# Patient Record
Sex: Female | Born: 1965 | Hispanic: Yes | Marital: Married | Smoking: Never smoker
Health system: Southern US, Community
[De-identification: ages and names within clinical notes are randomized; demographics above are authoritative.]

## PROBLEM LIST (undated history)

## (undated) DIAGNOSIS — I10 Essential (primary) hypertension: Secondary | ICD-10-CM

---

## 2007-02-03 ENCOUNTER — Ambulatory Visit: Payer: Self-pay

## 2011-10-07 ENCOUNTER — Ambulatory Visit: Payer: Self-pay | Admitting: Internal Medicine

## 2014-08-10 ENCOUNTER — Emergency Department
Admit: 2014-08-10 | Disposition: A | Discharge status: Home / Self Care | Payer: Self-pay | Admitting: Emergency Medicine

## 2014-09-07 ENCOUNTER — Encounter: Payer: Self-pay | Admitting: Emergency Medicine

## 2014-09-07 ENCOUNTER — Emergency Department
Admission: EM | Admit: 2014-09-07 | Discharge: 2014-09-07 | Disposition: A | Discharge status: Home / Self Care | Payer: Self-pay | Attending: Internal Medicine | Admitting: Internal Medicine

## 2014-09-07 ENCOUNTER — Emergency Department: Payer: Self-pay

## 2014-09-07 ENCOUNTER — Other Ambulatory Visit: Payer: Self-pay

## 2014-09-07 DIAGNOSIS — R197 Diarrhea, unspecified: Secondary | ICD-10-CM | POA: Insufficient documentation

## 2014-09-07 DIAGNOSIS — I10 Essential (primary) hypertension: Secondary | ICD-10-CM | POA: Insufficient documentation

## 2014-09-07 DIAGNOSIS — N390 Urinary tract infection, site not specified: Secondary | ICD-10-CM | POA: Insufficient documentation

## 2014-09-07 DIAGNOSIS — Z3202 Encounter for pregnancy test, result negative: Secondary | ICD-10-CM | POA: Insufficient documentation

## 2014-09-07 DIAGNOSIS — Z79899 Other long term (current) drug therapy: Secondary | ICD-10-CM | POA: Insufficient documentation

## 2014-09-07 DIAGNOSIS — R109 Unspecified abdominal pain: Secondary | ICD-10-CM

## 2014-09-07 HISTORY — DX: Essential (primary) hypertension: I10

## 2014-09-07 LAB — CBC WITH DIFFERENTIAL/PLATELET
BASOS ABS: 0 10*3/uL (ref 0–0.1)
BASOS PCT: 0 %
EOS ABS: 0.3 10*3/uL (ref 0–0.7)
Eosinophils Relative: 4 %
HCT: 37.8 % (ref 35.0–47.0)
Hemoglobin: 12.1 g/dL (ref 12.0–16.0)
LYMPHS ABS: 1.3 10*3/uL (ref 1.0–3.6)
Lymphocytes Relative: 15 %
MCH: 25.8 pg — AB (ref 26.0–34.0)
MCHC: 32 g/dL (ref 32.0–36.0)
MCV: 80.7 fL (ref 80.0–100.0)
Monocytes Absolute: 0.5 10*3/uL (ref 0.2–0.9)
Monocytes Relative: 6 %
NEUTROS PCT: 75 %
Neutro Abs: 6.5 10*3/uL (ref 1.4–6.5)
PLATELETS: 348 10*3/uL (ref 150–440)
RBC: 4.69 MIL/uL (ref 3.80–5.20)
RDW: 15.3 % — ABNORMAL HIGH (ref 11.5–14.5)
WBC: 8.6 10*3/uL (ref 3.6–11.0)

## 2014-09-07 LAB — COMPREHENSIVE METABOLIC PANEL
ALK PHOS: 89 U/L (ref 38–126)
ALT: 35 U/L (ref 14–54)
ANION GAP: 8 (ref 5–15)
AST: 27 U/L (ref 15–41)
Albumin: 4.4 g/dL (ref 3.5–5.0)
BILIRUBIN TOTAL: 0.3 mg/dL (ref 0.3–1.2)
BUN: 14 mg/dL (ref 6–20)
CO2: 29 mmol/L (ref 22–32)
Calcium: 8.3 mg/dL — ABNORMAL LOW (ref 8.9–10.3)
Chloride: 102 mmol/L (ref 101–111)
Creatinine, Ser: 0.6 mg/dL (ref 0.44–1.00)
GLUCOSE: 103 mg/dL — AB (ref 65–99)
POTASSIUM: 3.7 mmol/L (ref 3.5–5.1)
Sodium: 139 mmol/L (ref 135–145)
Total Protein: 8.1 g/dL (ref 6.5–8.1)

## 2014-09-07 LAB — URINALYSIS COMPLETE WITH MICROSCOPIC (ARMC ONLY)
BILIRUBIN URINE: NEGATIVE
GLUCOSE, UA: NEGATIVE mg/dL
Ketones, ur: NEGATIVE mg/dL
Nitrite: NEGATIVE
PROTEIN: NEGATIVE mg/dL
SPECIFIC GRAVITY, URINE: 1.015 (ref 1.005–1.030)
pH: 5 (ref 5.0–8.0)

## 2014-09-07 LAB — POCT PREGNANCY, URINE: PREG TEST UR: NEGATIVE

## 2014-09-07 LAB — LIPASE, BLOOD: Lipase: 36 U/L (ref 22–51)

## 2014-09-07 MED ORDER — DICYCLOMINE HCL 10 MG PO CAPS
10.0000 mg | ORAL_CAPSULE | Freq: Once | ORAL | Status: AC
  Administered 2014-09-07: 10 mg via ORAL

## 2014-09-07 MED ORDER — LOPERAMIDE HCL 2 MG PO TABS: 2.0000 mg | ORAL_TABLET | Freq: Four times a day (QID) | ORAL | Status: AC | PRN

## 2014-09-07 MED ORDER — CIPROFLOXACIN HCL 500 MG PO TABS: 500.0000 mg | ORAL_TABLET | Freq: Two times a day (BID) | ORAL | Status: AC

## 2014-09-07 MED ORDER — DICYCLOMINE HCL 20 MG PO TABS: 20.0000 mg | ORAL_TABLET | Freq: Three times a day (TID) | ORAL | Status: AC | PRN

## 2014-09-07 MED ORDER — DICYCLOMINE HCL 10 MG PO CAPS
ORAL_CAPSULE | ORAL | Status: AC
  Filled 2014-09-07: qty 1

## 2014-09-07 NOTE — Discharge Instructions (Signed)
See your doctor in follow-up as instructed. If your symptoms do not improve or you develop any new or worsening symptoms return to the emergency department. Take the medication as prescribed.

## 2014-09-07 NOTE — ED Provider Notes (Signed)
[facility] Emergency Department Provider Note  ____________________________________________  Time seen: Approximately 9:03 PM  I have reviewed the triage vital signs and the nursing notes.   HISTORY  Chief Complaint Abdominal Pain    HPI Angela Hendricks is a 49 y.o. female with a chief complaint of abdominal pain 5 days . The pain started in the epigastric area 5 days ago and has continued to be in that area it is been for the past 5 days it is worse after she tries to eat, it is moderate 5/10 in severity. She complains of cramping in the abdomen associated with daily diarrhea for the past 5 days.   Today she had 5 bowel movements. The pain in the abdomen comes in waves. She has had 2 episodes of vomiting in the past 5 days the last episode was last evening. She has had decreased by mouth intake for the past several days because of the abdominal pain and diarrhea.   She has had chills but is unaware of having any fever. She's had no travel in the past 6 months.     Past Medical History  Diagnosis Date  . Hypertension     There are no active problems to display for this patient.   History reviewed. No pertinent past surgical history.  Current Outpatient Rx  Name  Route  Sig  Dispense  Refill  . hydrochlorothiazide (HYDRODIURIL) 25 MG tablet   Oral   Take 25 mg by mouth daily.         . traMADol (ULTRAM) 50 MG tablet   Oral   Take 50 mg by mouth every 6 (six) hours as needed.           Allergies Review of patient's allergies indicates no known allergies.  History reviewed. No pertinent family history.  Social History History  Substance Use Topics  . Smoking status: Never Smoker   . Smokeless tobacco: Not on file  . Alcohol Use: Yes    Review of Systems Constitutional: No fever/chills Eyes: No visual changes. ENT: No sore throat. Cardiovascular: Denies chest pain. Respiratory: Denies shortness of  breath. Gastrointestinal: No abdominal pain.  No nausea, no vomiting.  No diarrhea.  No constipation. Genitourinary: Negative for dysuria. Musculoskeletal: Negative for back pain. Skin: Negative for rash. Neurological: Negative for headaches, focal weakness or numbness.  10-point ROS otherwise negative.  ____________________________________________   PHYSICAL EXAM:  VITAL SIGNS: ED Triage Vitals  Enc Vitals Group     BP 09/07/14 1716 128/74 mmHg     Pulse Rate 09/07/14 1716 73     Resp --      Temp 09/07/14 1716 98.3 F (36.8 C)     Temp Source 09/07/14 1716 Oral     SpO2 09/07/14 1716 100 %     Weight 09/07/14 1716 180 lb (81.647 kg)     Height 09/07/14 1716 5\' 3"  (1.6 m)     Head Cir --      Peak Flow --      Pain Score --      Pain Loc --      Pain Edu? --      Excl. in GC? --     Constitutional: Alert and oriented. Well appearing and in no acute distress. Eyes: Conjunctivae are normal. PERRL. EOMI. Head: Atraumatic. Nose: No congestion/rhinnorhea. Mouth/Throat: Mucous membranes are moist.  Oropharynx non-erythematous. Neck: No stridor.   Cardiovascular: Normal rate, regular rhythm. Grossly normal heart sounds.  Good peripheral  circulation. Respiratory: Normal respiratory effort.  No retractions. Lungs CTAB. Gastrointestinal: Soft and slightly tender in the epigastric area or guarding no rebound.. No distention. No abdominal bruits. No CVA tenderness. Musculoskeletal: No lower extremity tenderness nor edema.  No joint effusions. Neurologic:  Normal speech and language. No gross focal neurologic deficits are appreciated. Speech is normal. No gait instability. Skin:  Skin is warm, dry and intact. No rash noted. Psychiatric: Mood and affect are normal. Speech and behavior are normal.  ____________________________________________   LABS (all labs ordered are listed, but only abnormal results are displayed)  Labs Reviewed  CBC WITH DIFFERENTIAL/PLATELET - Abnormal;  Notable for the following:    MCH 25.8 (*)    RDW 15.3 (*)    All other components within normal limits  COMPREHENSIVE METABOLIC PANEL - Abnormal; Notable for the following:    Glucose, Bld 103 (*)    Calcium 8.3 (*)    All other components within normal limits  URINALYSIS COMPLETEWITH MICROSCOPIC ([facility])  - Abnormal; Notable for the following:    Color, Urine YELLOW (*)    APPearance HAZY (*)    Hgb urine dipstick 2+ (*)    Leukocytes, UA 1+ (*)    Bacteria, UA RARE (*)    Squamous Epithelial / LPF 6-30 (*)    All other components within normal limits  LIPASE, BLOOD  POCT PREGNANCY, URINE   __Urine is significant for blood 2+1+ leukocytes and rare bacteria Her WBC is normal at 8.6.  Her rest of her comprehensive medical panel is normal her LFTs are normal. Her glucose is slightly elevated at 103 which is not significant __________________________________________  EKG  ED ECG REPORT   Date: 09/07/2014  EKG Time: 17:24  Rate: 73  Rhythm: normal EKG, normal sinus rhythm, unchanged from previous tracings, normal sinus rhythm  Axis: Normal  Intervals:none  ST&T Change: Nonspecific ST-T wave changes  ____________________________________________  RADIOLOGY  IMPRESSION:abd 3 way x ray No evidence of active pulmonary disease. Normal nonobstructive bowel gas pattern.   Electronically Signed By: Burman NievesWilliam Stevens M.D. On: 09/07/2014 22:07       ____________________________________________   PROCEDURES  Procedure(s) performed: None  Critical Care performed: No  ____________________________________________   INITIAL IMPRESSION / ASSESSMENT AND PLAN / ED COURSE  Pertinent labs & imaging results that were available during my care of the patient were reviewed by me and considered in my medical decision making (see chart for details).  This 49 year old Hispanic female presents with a chief complaint a 5 day history of colicky crampy abdominal pain associated  with predominantly diarrhea and minimal amount of vomiting.  Her abdominal exam is benign. Her white count is not elevated. Her electrolytes are normal. Plain films in the emergency department showed no significant etiology for her pain. Her urine is consistent with a mild UTI  In the emergency department she received Bentyl by mouth. She will be treated with antidiarrhea medication and ciprofloxacin for her urinary tract infection and Bentyl for crampy abdominal pain and discharged. ____________________________________________   FINAL CLINICAL IMPRESSION(S) / ED DIAGNOSES  Final diagnoses:  None   1. Diarrhea 2. UTI 3. Abdominal cramps    Sherlyn HaySheryl L Chloe Miyoshi, DO 09/07/14 2245

## 2014-09-07 NOTE — ED Notes (Signed)
Pt reports upper abd pain since Saturday.  Vomited x 1 last pm

## 2014-09-07 NOTE — ED Notes (Signed)
Pt verbalizes understanding of discharge instructions.

## 2016-07-25 IMAGING — CR DG ABDOMEN ACUTE W/ 1V CHEST
1 series · 4 of 4 positions shown · non-contrast
Comparison: None.

CLINICAL DATA: Nausea, vomiting, and diarrhea with fever for 5
days.

EXAM:
DG ABDOMEN ACUTE W/ 1V CHEST

[Series 1: w chest pa · 0.14mm/px · 4 of 4 slices shown]
[im 1/4]
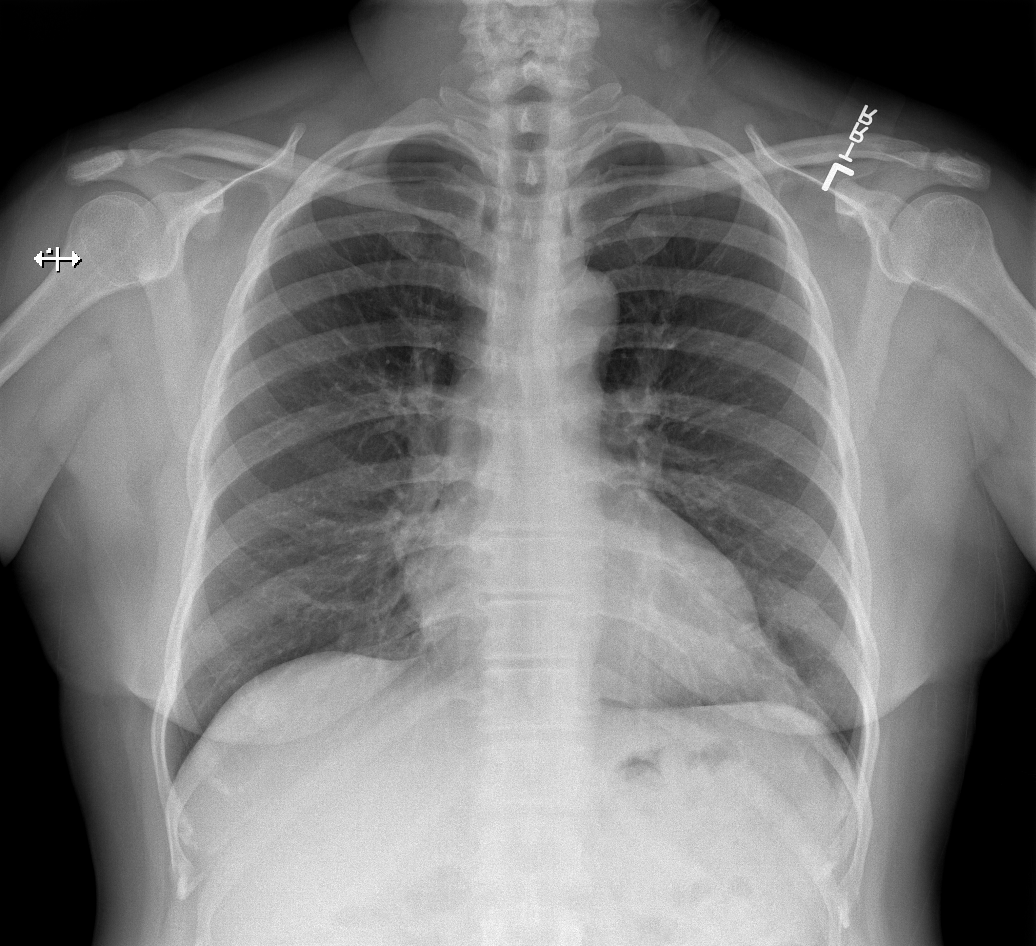
[im 2/4]
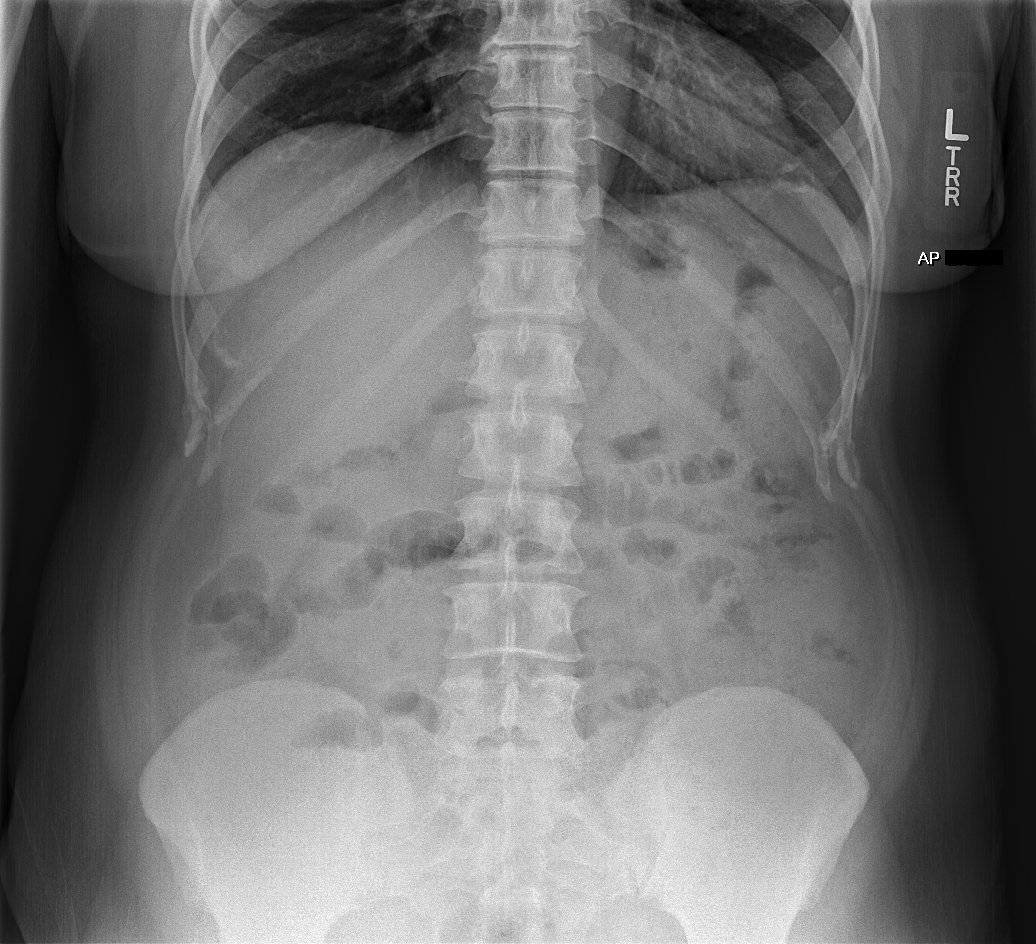
[im 3/4]
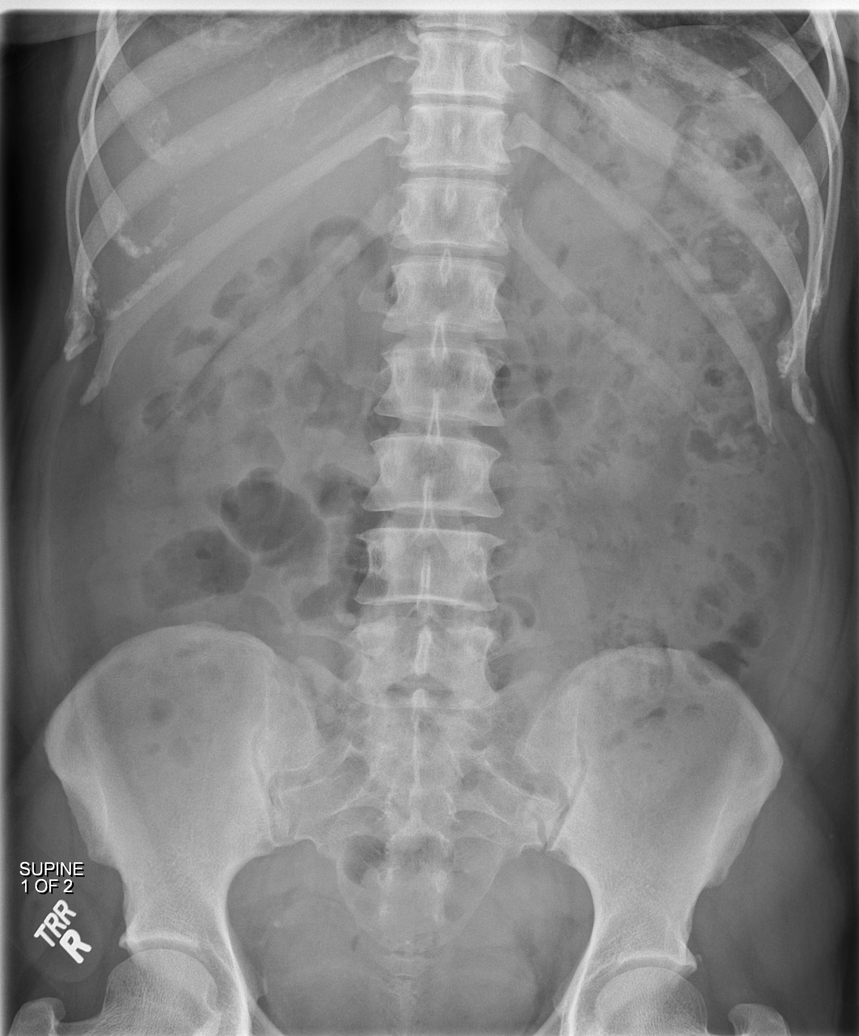
[im 4/4]
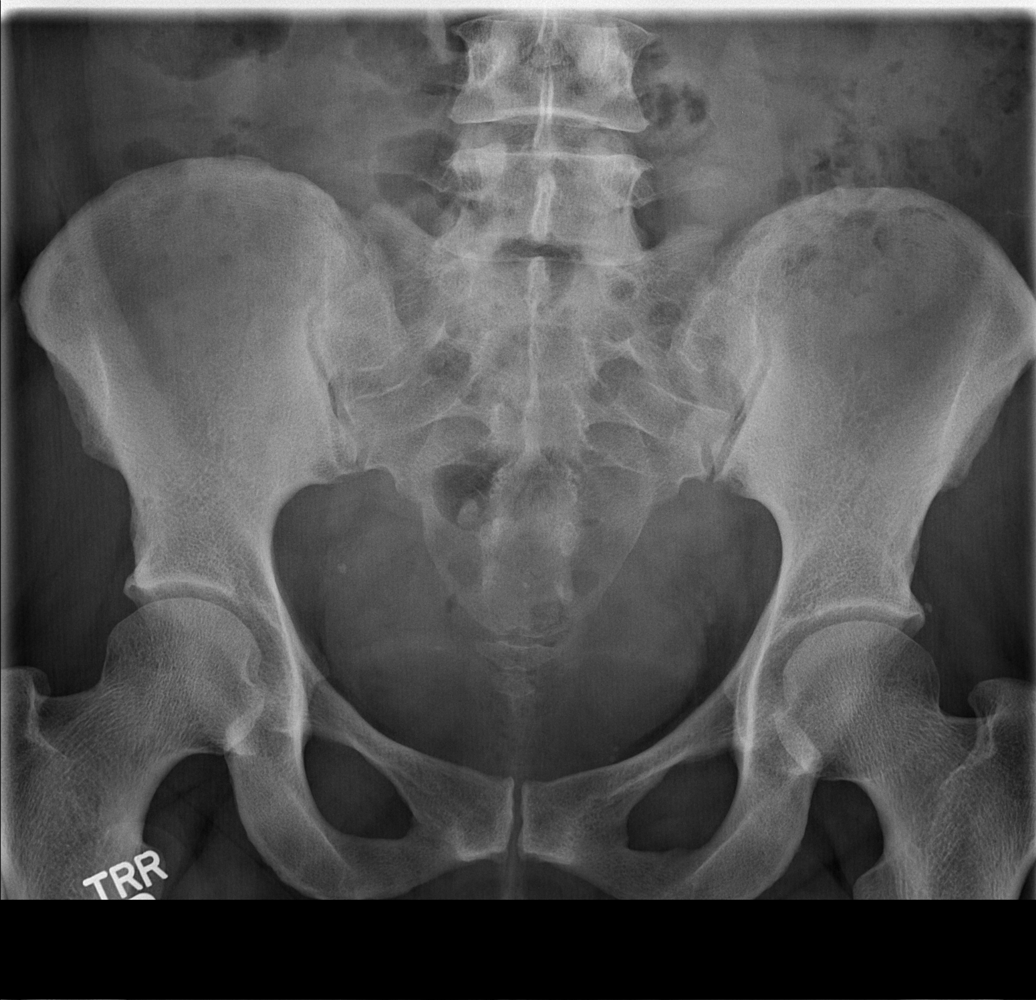

[4 of 4 positions shown; findings below may reference images not displayed]

FINDINGS: Normal heart size and pulmonary vascularity. No focal airspace
disease or consolidation in the lungs. No blunting of costophrenic
angles. No pneumothorax. Mediastinal contours appear intact.

Scattered gas and stool in the colon. No small or large bowel
distention. No free intra-abdominal air. No abnormal air-fluid
levels. No radiopaque stones. Visualized bones appear intact.
IMPRESSION: No evidence of active pulmonary disease. Normal nonobstructive bowel
gas pattern.

## 2016-12-25 ENCOUNTER — Ambulatory Visit
Admission: RE | Admit: 2016-12-25 | Discharge: 2016-12-25 | Disposition: A | Discharge status: Home / Self Care | Payer: Self-pay | Source: Ambulatory Visit | Attending: Oncology | Admitting: Oncology

## 2016-12-25 ENCOUNTER — Ambulatory Visit: Payer: Self-pay | Attending: Oncology

## 2016-12-25 VITALS — BP 133/84 | HR 67

## 2016-12-25 DIAGNOSIS — Z0000 Encounter for general adult medical examination without abnormal findings: Secondary | ICD-10-CM

## 2016-12-25 NOTE — Progress Notes (Signed)
Subjective:     Patient ID: Angela Hendricks, female   DOB: [birth year], 51 y.o.   MRN: [id number]  HPI   Review of Systems     Objective:   Physical Exam  Pulmonary/Chest: Right breast exhibits no inverted nipple, no mass, no nipple discharge, no skin change and no tenderness. Left breast exhibits no inverted nipple, no mass, no nipple discharge, no skin change and no tenderness. Breasts are symmetrical.       Assessment:     51 [facility].  Previous mammogram 2008, with Birads 2 result. Patient screened, and meets [facility] eligibility.  Patient does not have insurance, Medicare or Medicaid.  Handout given on Affordable Care Act. Instructed patient on breast self-exam using teach back method.  CBE unremarkable.  No mass or lump palpated.  Patient works in [employer]    Plan:     Sent for bilateral screening mammogram.

## 2016-12-26 NOTE — Progress Notes (Signed)
Letter mailed from [facility] to notify of normal mammogram results.  Patient to return in one year for annual screening.  Copy to [facility]. 

## 2018-10-02 ENCOUNTER — Other Ambulatory Visit: Payer: Self-pay

## 2018-10-02 ENCOUNTER — Telehealth: Payer: Self-pay

## 2018-10-02 DIAGNOSIS — Z20822 Contact with and (suspected) exposure to covid-19: Secondary | ICD-10-CM

## 2018-10-02 NOTE — Telephone Encounter (Signed)
[facility] Dept. Request COVID 19 test. 

## 2018-10-07 ENCOUNTER — Telehealth: Payer: Self-pay

## 2018-10-07 LAB — NOVEL CORONAVIRUS, NAA: SARS-CoV-2, NAA: DETECTED — AB

## 2018-10-07 NOTE — Telephone Encounter (Signed)
Call receive from [place]   To report patient COVID-19 test results. Lab note of Rhae Lerner 10/05/2018. Pt has been informed of results and provider notified.

## 2022-02-28 ENCOUNTER — Other Ambulatory Visit: Payer: Self-pay

## 2022-02-28 DIAGNOSIS — Z1231 Encounter for screening mammogram for malignant neoplasm of breast: Secondary | ICD-10-CM

## 2022-03-05 ENCOUNTER — Ambulatory Visit: Payer: Self-pay | Attending: Hematology and Oncology

## 2023-03-25 ENCOUNTER — Encounter: Payer: Self-pay | Admitting: Obstetrics

## 2023-03-25 ENCOUNTER — Telehealth: Payer: Self-pay | Admitting: *Deleted
